# Patient Record
Sex: Female | Born: 1975 | Hispanic: No | Marital: Single | Smoking: Never smoker
Health system: Southern US, Community
[De-identification: ages and names within clinical notes are randomized; demographics above are authoritative.]

---

## 2002-05-03 ENCOUNTER — Other Ambulatory Visit: Admission: RE | Admit: 2002-05-03 | Discharge: 2002-05-03 | Payer: Self-pay | Admitting: Obstetrics and Gynecology

## 2003-07-11 ENCOUNTER — Other Ambulatory Visit: Admission: RE | Admit: 2003-07-11 | Discharge: 2003-07-11 | Payer: Self-pay | Admitting: Obstetrics and Gynecology

## 2004-11-12 ENCOUNTER — Other Ambulatory Visit: Admission: RE | Admit: 2004-11-12 | Discharge: 2004-11-12 | Payer: Self-pay | Admitting: Obstetrics and Gynecology

## 2005-11-18 ENCOUNTER — Other Ambulatory Visit: Admission: RE | Admit: 2005-11-18 | Discharge: 2005-11-18 | Payer: Self-pay | Admitting: Obstetrics and Gynecology

## 2008-11-03 ENCOUNTER — Ambulatory Visit: Payer: Self-pay | Admitting: Family Medicine

## 2012-05-12 ENCOUNTER — Other Ambulatory Visit: Payer: Self-pay | Admitting: Obstetrics and Gynecology

## 2012-05-12 DIAGNOSIS — N63 Unspecified lump in unspecified breast: Secondary | ICD-10-CM

## 2012-05-20 ENCOUNTER — Ambulatory Visit
Admission: RE | Admit: 2012-05-20 | Discharge: 2012-05-20 | Disposition: A | Discharge status: Home / Self Care | Payer: 59 | Source: Ambulatory Visit | Attending: Obstetrics and Gynecology | Admitting: Obstetrics and Gynecology

## 2012-05-20 ENCOUNTER — Other Ambulatory Visit: Payer: Self-pay | Admitting: Obstetrics and Gynecology

## 2012-05-20 DIAGNOSIS — N63 Unspecified lump in unspecified breast: Secondary | ICD-10-CM

## 2012-10-21 ENCOUNTER — Other Ambulatory Visit: Payer: Self-pay | Admitting: Obstetrics and Gynecology

## 2012-10-21 DIAGNOSIS — N63 Unspecified lump in unspecified breast: Secondary | ICD-10-CM

## 2012-11-23 ENCOUNTER — Ambulatory Visit
Admission: RE | Admit: 2012-11-23 | Discharge: 2012-11-23 | Disposition: A | Discharge status: Home / Self Care | Payer: 59 | Source: Ambulatory Visit | Attending: Obstetrics and Gynecology | Admitting: Obstetrics and Gynecology

## 2012-11-23 DIAGNOSIS — N63 Unspecified lump in unspecified breast: Secondary | ICD-10-CM

## 2013-05-09 ENCOUNTER — Other Ambulatory Visit: Payer: Self-pay | Admitting: Obstetrics and Gynecology

## 2013-05-09 DIAGNOSIS — N63 Unspecified lump in unspecified breast: Secondary | ICD-10-CM

## 2013-05-20 ENCOUNTER — Other Ambulatory Visit: Payer: 59

## 2013-05-23 ENCOUNTER — Ambulatory Visit
Admission: RE | Admit: 2013-05-23 | Discharge: 2013-05-23 | Disposition: A | Discharge status: Home / Self Care | Payer: 59 | Source: Ambulatory Visit | Attending: Obstetrics and Gynecology | Admitting: Obstetrics and Gynecology

## 2013-05-23 DIAGNOSIS — N63 Unspecified lump in unspecified breast: Secondary | ICD-10-CM

## 2013-07-04 DIAGNOSIS — L905 Scar conditions and fibrosis of skin: Secondary | ICD-10-CM | POA: Insufficient documentation

## 2013-07-13 ENCOUNTER — Encounter (INDEPENDENT_AMBULATORY_CARE_PROVIDER_SITE_OTHER): Payer: 59 | Admitting: Ophthalmology

## 2013-07-13 DIAGNOSIS — H35729 Serous detachment of retinal pigment epithelium, unspecified eye: Secondary | ICD-10-CM

## 2013-07-13 DIAGNOSIS — H33309 Unspecified retinal break, unspecified eye: Secondary | ICD-10-CM

## 2013-07-13 DIAGNOSIS — H43819 Vitreous degeneration, unspecified eye: Secondary | ICD-10-CM

## 2013-07-13 DIAGNOSIS — H3530 Unspecified macular degeneration: Secondary | ICD-10-CM

## 2013-07-14 ENCOUNTER — Encounter (INDEPENDENT_AMBULATORY_CARE_PROVIDER_SITE_OTHER): Payer: Self-pay | Admitting: Ophthalmology

## 2013-07-29 ENCOUNTER — Ambulatory Visit (INDEPENDENT_AMBULATORY_CARE_PROVIDER_SITE_OTHER): Payer: 59 | Admitting: Ophthalmology

## 2013-07-29 DIAGNOSIS — H33309 Unspecified retinal break, unspecified eye: Secondary | ICD-10-CM

## 2013-07-29 DIAGNOSIS — H35719 Central serous chorioretinopathy, unspecified eye: Secondary | ICD-10-CM

## 2013-09-05 ENCOUNTER — Encounter (INDEPENDENT_AMBULATORY_CARE_PROVIDER_SITE_OTHER): Payer: 59 | Admitting: Ophthalmology

## 2013-09-05 DIAGNOSIS — H33309 Unspecified retinal break, unspecified eye: Secondary | ICD-10-CM

## 2013-09-05 DIAGNOSIS — H43819 Vitreous degeneration, unspecified eye: Secondary | ICD-10-CM

## 2013-09-05 DIAGNOSIS — H35719 Central serous chorioretinopathy, unspecified eye: Secondary | ICD-10-CM

## 2014-01-10 ENCOUNTER — Ambulatory Visit (INDEPENDENT_AMBULATORY_CARE_PROVIDER_SITE_OTHER): Payer: 59 | Admitting: Ophthalmology

## 2014-01-10 DIAGNOSIS — H43819 Vitreous degeneration, unspecified eye: Secondary | ICD-10-CM

## 2014-01-10 DIAGNOSIS — H3530 Unspecified macular degeneration: Secondary | ICD-10-CM

## 2014-01-10 DIAGNOSIS — H35719 Central serous chorioretinopathy, unspecified eye: Secondary | ICD-10-CM

## 2014-07-25 ENCOUNTER — Other Ambulatory Visit: Payer: Self-pay | Admitting: Obstetrics and Gynecology

## 2014-07-25 DIAGNOSIS — D242 Benign neoplasm of left breast: Secondary | ICD-10-CM

## 2014-08-11 ENCOUNTER — Ambulatory Visit
Admission: RE | Admit: 2014-08-11 | Discharge: 2014-08-11 | Disposition: A | Discharge status: Home / Self Care | Payer: 59 | Source: Ambulatory Visit | Attending: Obstetrics and Gynecology | Admitting: Obstetrics and Gynecology

## 2014-08-11 DIAGNOSIS — D242 Benign neoplasm of left breast: Secondary | ICD-10-CM

## 2016-04-07 ENCOUNTER — Other Ambulatory Visit: Payer: Self-pay | Admitting: Obstetrics and Gynecology

## 2016-04-07 DIAGNOSIS — Z1231 Encounter for screening mammogram for malignant neoplasm of breast: Secondary | ICD-10-CM

## 2016-05-14 ENCOUNTER — Ambulatory Visit
Admission: RE | Admit: 2016-05-14 | Discharge: 2016-05-14 | Disposition: A | Discharge status: Home / Self Care | Payer: 59 | Source: Ambulatory Visit | Attending: Obstetrics and Gynecology | Admitting: Obstetrics and Gynecology

## 2016-05-14 DIAGNOSIS — Z1231 Encounter for screening mammogram for malignant neoplasm of breast: Secondary | ICD-10-CM

## 2017-05-08 DIAGNOSIS — E039 Hypothyroidism, unspecified: Secondary | ICD-10-CM | POA: Insufficient documentation

## 2017-05-08 DIAGNOSIS — E559 Vitamin D deficiency, unspecified: Secondary | ICD-10-CM | POA: Insufficient documentation

## 2017-12-08 DIAGNOSIS — Z9889 Other specified postprocedural states: Secondary | ICD-10-CM | POA: Insufficient documentation

## 2018-01-17 IMAGING — MG 2D DIGITAL SCREENING BILATERAL MAMMOGRAM WITH CAD AND ADJUNCT TO
9 of 12 series · 9 of 28 positions shown · non-contrast
Comparison: Previous exam(s).

CLINICAL DATA: Screening.

EXAM:
2D DIGITAL SCREENING BILATERAL MAMMOGRAM WITH CAD AND ADJUNCT TOMO

[L MLO synth-2D]
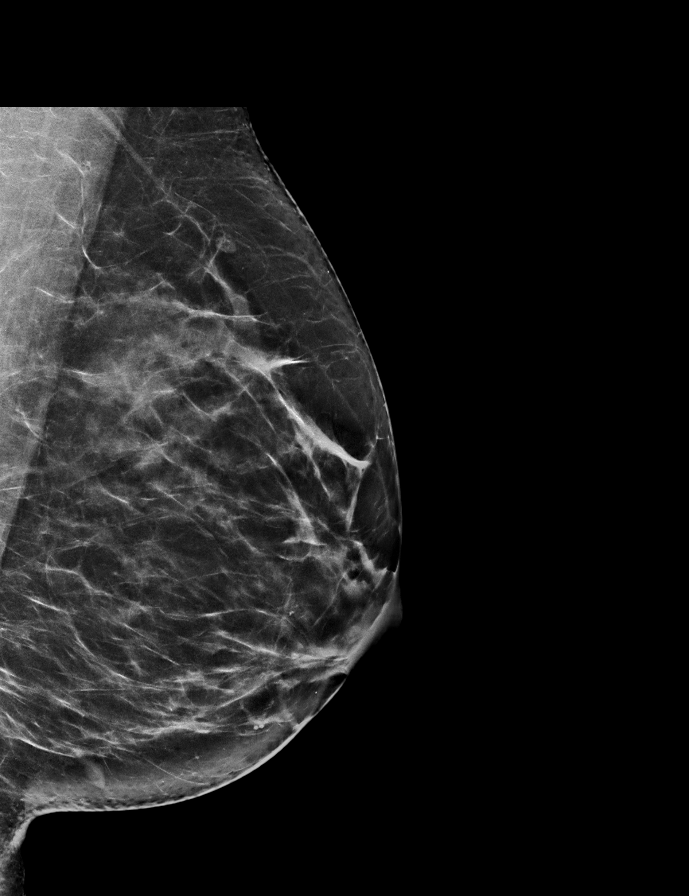

[R CC synth-2D]
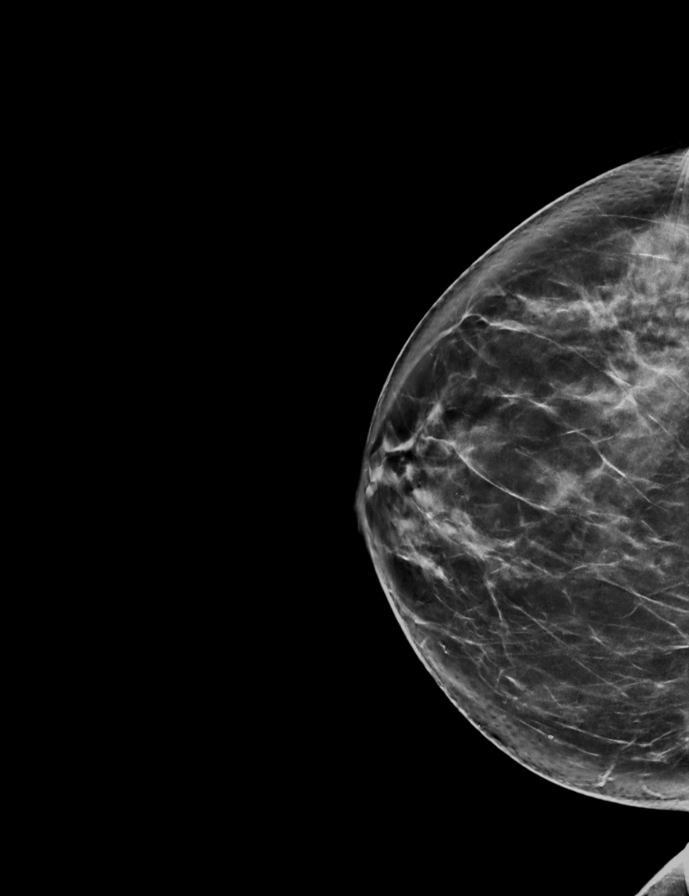

[L MLO]
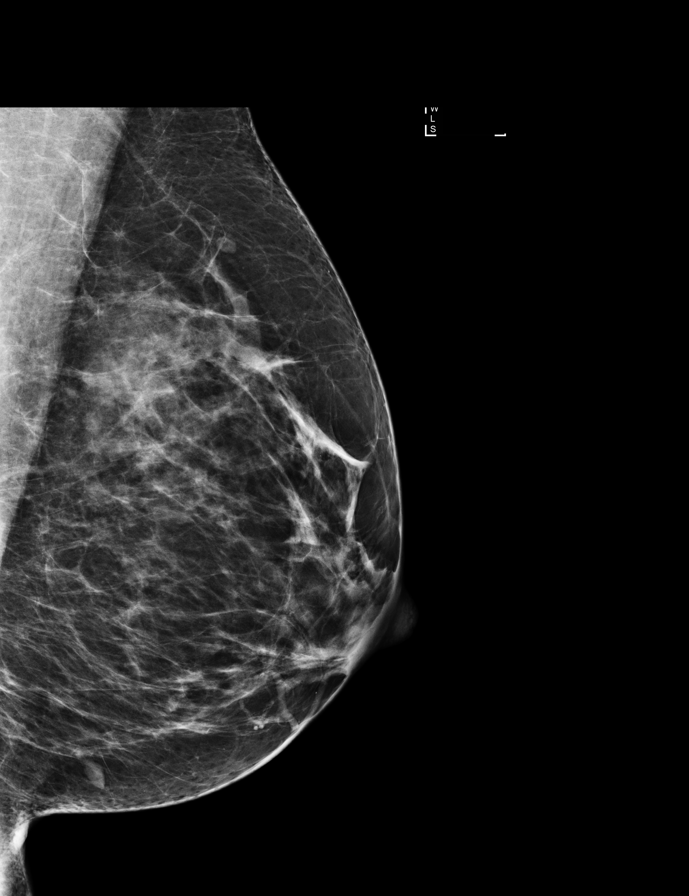

[R CC]
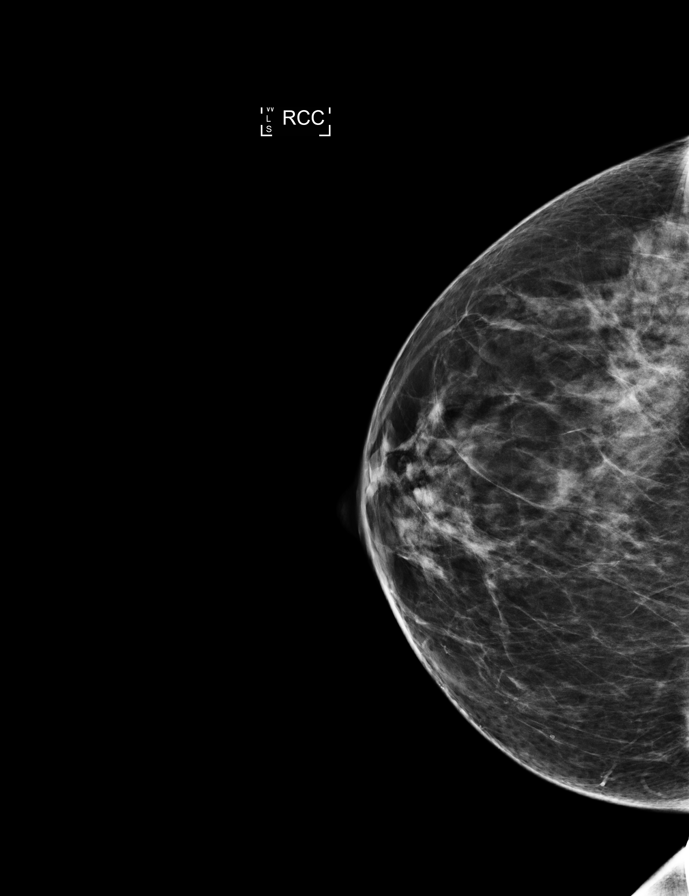

[R MLO]
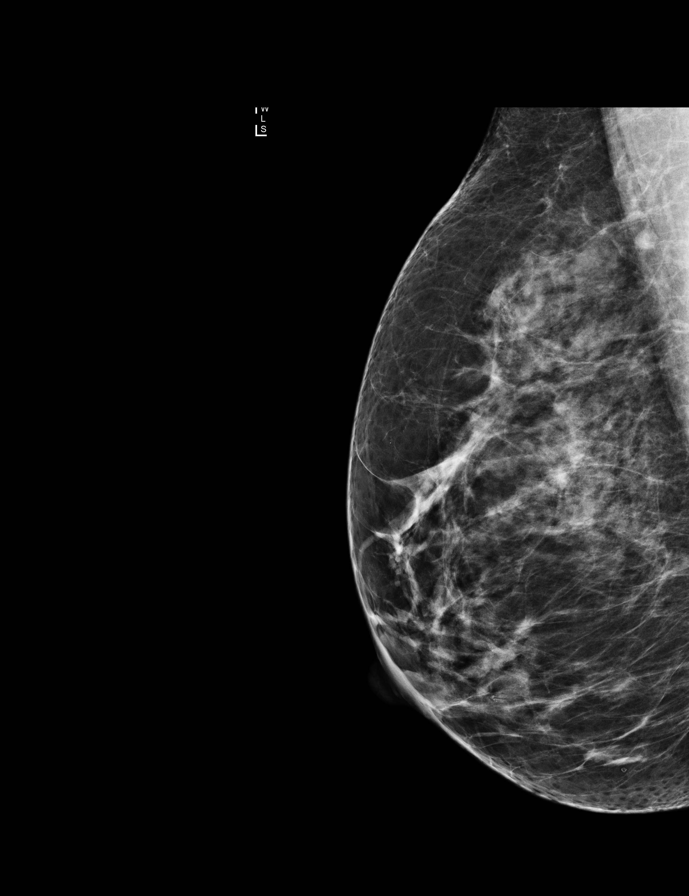

[L CC synth-2D]
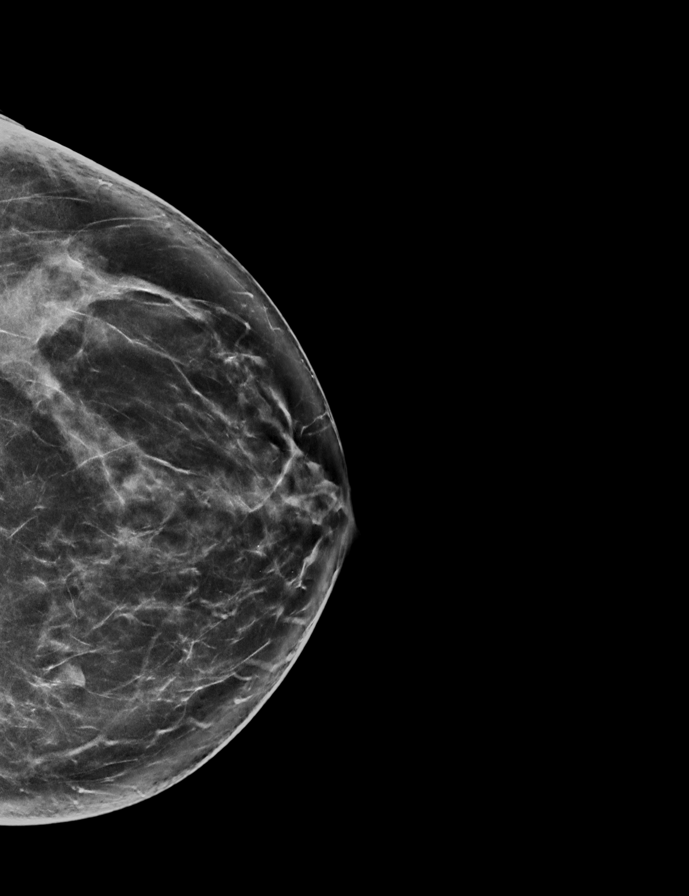

[L CC]
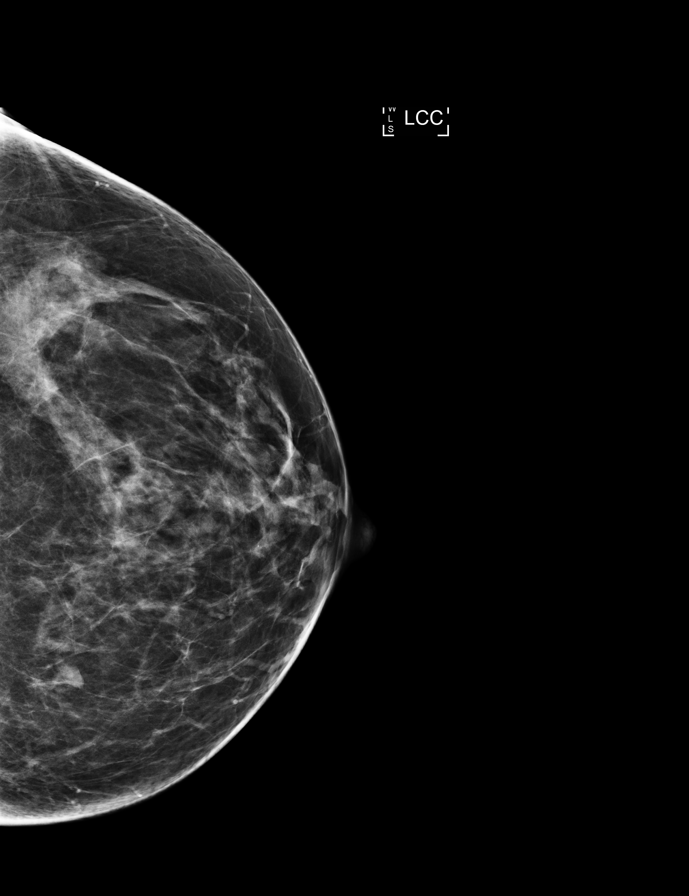

[R MLO synth-2D]
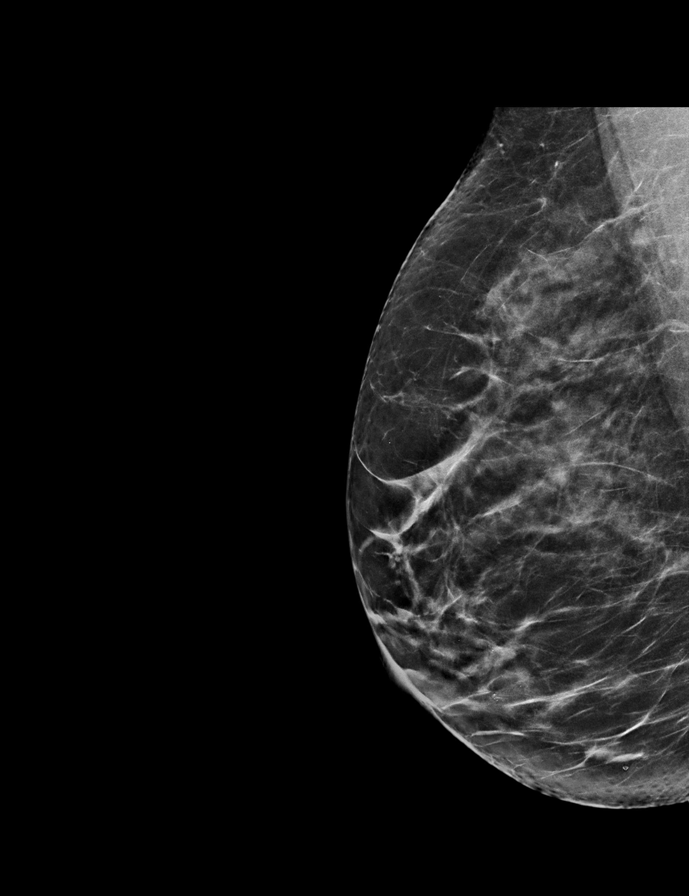

[R MLO tomo · tomo slice 33/66.0]
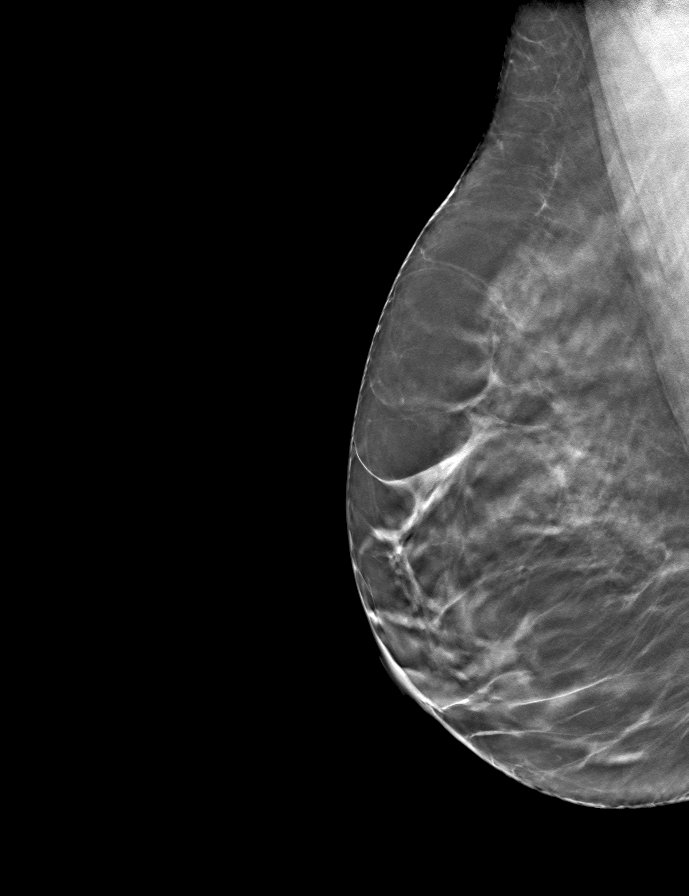

[9 of 28 positions shown; findings below may reference images not displayed]

ACR Breast Density Category b: There are scattered areas of
fibroglandular density.
FINDINGS: There are no findings suspicious for malignancy. Images were
processed with CAD.
IMPRESSION: No mammographic evidence of malignancy. A result letter of this
screening mammogram will be mailed directly to the patient.

RECOMMENDATION:
Screening mammogram in one year. (Code:97-6-RS4)

BI-RADS CATEGORY  1: Negative.

## 2019-10-19 ENCOUNTER — Telehealth: Payer: Self-pay | Admitting: Family Medicine

## 2019-10-19 NOTE — Telephone Encounter (Signed)
Patient called wanting to get a status on her functional medicine appointment.  She stated that you can leave her a detailed message on her work number of [phone].  If she does not answer, she will call you back.  Thank you.

## 2019-10-19 NOTE — Telephone Encounter (Signed)
You have her email address - it is in a staff message. You were going to email her first. Are we to make her an appointment?

## 2019-10-19 NOTE — Telephone Encounter (Signed)
Please advise 

## 2019-10-20 NOTE — Telephone Encounter (Signed)
Somehow the message was in archives and I never saw it.  I sent her an email just now.

## 2019-10-20 NOTE — Telephone Encounter (Signed)
I left a voice mail (patient stated her full name on the message) for patient to check her email for a message from Dr. Junius Roads.

## 2019-10-21 ENCOUNTER — Telehealth: Payer: Self-pay | Admitting: Family Medicine

## 2019-10-21 NOTE — Telephone Encounter (Signed)
Patient called requesting Terri K. e-mail her. Patient states she is extremely busy and Terri and herself have been playing phone tag. Patient e-mail is [email or web address].

## 2019-10-25 NOTE — Telephone Encounter (Signed)
At your convenience, will you call this patient to schedule a 40-minute appointment with Dr. Junius Roads - new functional medicine patient. I cannot email her, as she wishes. She would be a good candidate for MyChart.

## 2019-11-11 ENCOUNTER — Ambulatory Visit (INDEPENDENT_AMBULATORY_CARE_PROVIDER_SITE_OTHER): Payer: Managed Care, Other (non HMO) | Admitting: Family Medicine

## 2019-11-11 ENCOUNTER — Other Ambulatory Visit: Payer: Self-pay

## 2019-11-11 ENCOUNTER — Encounter: Payer: Self-pay | Admitting: Family Medicine

## 2019-11-11 VITALS — BP 105/72 | HR 88 | Ht 66.5 in | Wt 167.0 lb

## 2019-11-11 DIAGNOSIS — R5382 Chronic fatigue, unspecified: Secondary | ICD-10-CM | POA: Diagnosis not present

## 2019-11-11 DIAGNOSIS — R21 Rash and other nonspecific skin eruption: Secondary | ICD-10-CM | POA: Diagnosis not present

## 2019-11-11 DIAGNOSIS — E039 Hypothyroidism, unspecified: Secondary | ICD-10-CM

## 2019-11-11 DIAGNOSIS — E559 Vitamin D deficiency, unspecified: Secondary | ICD-10-CM | POA: Diagnosis not present

## 2019-11-11 DIAGNOSIS — K5909 Other constipation: Secondary | ICD-10-CM

## 2019-11-11 MED ORDER — CLOTRIMAZOLE 1 % EX CREA: 1.0000 "application " | TOPICAL_CREAM | Freq: Two times a day (BID) | CUTANEOUS | 3 refills | Status: AC

## 2019-11-11 NOTE — Progress Notes (Signed)
Office Visit Note   Patient: Stacey Moon           Date of Birth: 1975-09-04           MRN: [id number] Visit Date: 11/11/2019 Requested by: No referring provider defined for this encounter. PCP: No primary care provider on file.  Subjective: Chief Complaint  Patient presents with  . FUNCTIONAL MEDICINE    would like to discuss what can be offered, nutritional testing    HPI: She is here to establish care.  She found me through the [facility] website.  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  She will finish her training in August.  She's doing this through the [employer].  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  It became extremely large and she required surgery.  She had extensive scar tissue.  Eventually it recurred only it was growing inward instead of outward and she had to have more surgeries for that.  She has had more than 20 surgeries per her report.  She has struggled with chronic fatigue.  She also has chronic constipation with gastrointestinal dysfunction.  She states that multiple family members have similar symptoms.  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  She also does not tolerate lactose.  She has never tried a food elimination diet.  She takes probiotics.  She has a history of hypothyroidism.  She does not know whether she is ever had antibodies checked.  She currently takes Synthroid for this.  Her last labs were in January.  She has developed a rash on her right anterior lower chest.  This has been present for several months.  Does not itch.  She has a history of vitamin D deficiency.  She is not currently taking anything for it.  She has been through a lot of stress in the past year.  Her company has been downsizing and she has been afraid of losing her job.  Unfortunately her 49 year old grandmother died, she was very close to her.  She is interested in having evaluation for  nutritional status.  Family history is positive for lymphoma in a couple family members.                 Objective: Vital Signs: BP 105/72   Pulse 88   Ht 5' 6.5" (1.689 m)   Wt 167 lb (75.8 kg)   BMI 26.55 kg/m   Physical Exam:  General:  Alert and oriented, in no acute distress. Pulm:  Breathing unlabored. Psy:  Normal mood, congruent affect. Skin: She has a rash below her eyebrows.  She has 2 circular salmon-colored lesions lower right anterior chest wall. HEENT:  [place]/AT, PERRLA, EOM Full, no nystagmus.  Funduscopic examination within normal limits.  No conjunctival erythema.  Tympanic membranes are pearly gray with normal landmarks.  External ear canals are normal.  Nasal passages are clear.  Oropharynx is clear.  No significant lymphadenopathy.  No thyromegaly or nodules.  2+ carotid pulses without bruits.  She has scarring on the right anterolateral neck. CV: Regular rate and rhythm without murmurs, rubs, or gallops.  No peripheral edema.  2+ radial and posterior tibial pulses. Lungs: Clear to auscultation throughout with no wheezing or areas of consolidation. Abd: Bowel sounds are active, no hepatosplenomegaly or masses.  Soft and nontender.  No audible bruits.  No evidence of ascites. Extremities: 2+ upper and lower DTRs.  No nail deformities.   Imaging: No results found.  Assessment & Plan: 1.  Chronic fatigue with constipation, suspect gut dysfunction. -We will contemplate doing stool testing through [facility] labs. -Consider a food elimination diet as well.  2.  Hypothyroidism -Labs today including thyroid antibodies.  3.  Rash of right lower anterior chest wall, possible tinea corporis -Trial of clotrimazole.  4. Nutritional status - We might order additional testing through [facility].     Procedures: No procedures performed  No notes on file     PMFS History: Patient Active Problem List   Diagnosis Date Noted  . Chronic fatigue 11/11/2019  . Chronic  constipation 11/11/2019  . H/O removal of neck cyst 12/08/2017  . Acquired hypothyroidism 05/08/2017  . Vitamin D deficiency 05/08/2017  . Scar of neck 07/04/2013   History reviewed. No pertinent past medical history.  History reviewed. No pertinent family history.  History reviewed. No pertinent surgical history. Social History   Occupational History  . Not on file  Tobacco Use  . Smoking status: Never Smoker  . Smokeless tobacco: Never Used  Substance and Sexual Activity  . Alcohol use: Yes    Comment: rare  . Drug use: Not on file  . Sexual activity: Not on file

## 2019-11-14 ENCOUNTER — Telehealth: Payer: Self-pay | Admitting: Family Medicine

## 2019-11-14 LAB — COMPREHENSIVE METABOLIC PANEL
AG Ratio: 1.8 (calc) (ref 1.0–2.5)
ALT: 13 U/L (ref 6–29)
AST: 15 U/L (ref 10–30)
Albumin: 4.4 g/dL (ref 3.6–5.1)
Alkaline phosphatase (APISO): 74 U/L (ref 31–125)
BUN: 13 mg/dL (ref 7–25)
CO2: 30 mmol/L (ref 20–32)
Calcium: 9.8 mg/dL (ref 8.6–10.2)
Chloride: 103 mmol/L (ref 98–110)
Creat: 0.88 mg/dL (ref 0.50–1.10)
Globulin: 2.5 g/dL (calc) (ref 1.9–3.7)
Glucose, Bld: 75 mg/dL (ref 65–99)
Potassium: 4.7 mmol/L (ref 3.5–5.3)
Sodium: 139 mmol/L (ref 135–146)
Total Bilirubin: 0.6 mg/dL (ref 0.2–1.2)
Total Protein: 6.9 g/dL (ref 6.1–8.1)

## 2019-11-14 LAB — CBC WITH DIFFERENTIAL/PLATELET
Absolute Monocytes: 490 cells/uL (ref 200–950)
Basophils Absolute: 59 cells/uL (ref 0–200)
Basophils Relative: 1.2 %
Eosinophils Absolute: 59 cells/uL (ref 15–500)
Eosinophils Relative: 1.2 %
HCT: 40.4 % (ref 35.0–45.0)
Hemoglobin: 13.5 g/dL (ref 11.7–15.5)
Lymphs Abs: 549 cells/uL — ABNORMAL LOW (ref 850–3900)
MCH: 32.1 pg (ref 27.0–33.0)
MCHC: 33.4 g/dL (ref 32.0–36.0)
MCV: 96 fL (ref 80.0–100.0)
MPV: 11.2 fL (ref 7.5–12.5)
Monocytes Relative: 10 %
Neutro Abs: 3744 cells/uL (ref 1500–7800)
Neutrophils Relative %: 76.4 %
Platelets: 264 10*3/uL (ref 140–400)
RBC: 4.21 10*6/uL (ref 3.80–5.10)
RDW: 11.8 % (ref 11.0–15.0)
Total Lymphocyte: 11.2 %
WBC: 4.9 10*3/uL (ref 3.8–10.8)

## 2019-11-14 LAB — LIPID PANEL
Cholesterol: 147 mg/dL (ref <200)
HDL: 59 mg/dL (ref >=50)
LDL Cholesterol (Calc): 74 mg/dL (calc)
Non-HDL Cholesterol (Calc): 88 mg/dL (calc) (ref <130)
Total CHOL/HDL Ratio: 2.5 (calc) (ref <5.0)
Triglycerides: 61 mg/dL (ref <150)

## 2019-11-14 LAB — THYROID PANEL WITH TSH
Free Thyroxine Index: 3.8 (ref 1.4–3.8)
T3 Uptake: 30 % (ref 22–35)
T4, Total: 12.5 ug/dL — ABNORMAL HIGH (ref 5.1–11.9)
TSH: 0.56 mIU/L

## 2019-11-14 LAB — ANA: Anti Nuclear Antibody (ANA): NEGATIVE

## 2019-11-14 LAB — VITAMIN D 25 HYDROXY (VIT D DEFICIENCY, FRACTURES): Vit D, 25-Hydroxy: 35 ng/mL (ref 30–100)

## 2019-11-14 LAB — HIGH SENSITIVITY CRP: hs-CRP: 5.5 mg/L — ABNORMAL HIGH

## 2019-11-14 LAB — THYROID PEROXIDASE ANTIBODY: Thyroperoxidase Ab SerPl-aCnc: 19 IU/mL — ABNORMAL HIGH (ref <9)

## 2019-11-14 NOTE — Telephone Encounter (Signed)
Labs are notable for the following:  C-reactive protein, inflammation marker, is elevated at 5.5.  Thyroid T4 is slightly elevated at 12.5, but TSH looks perfect at 0.56.  No changes needed in Synthroid dosage based on these numbers.  Thyroid antibodies are positive at 19, indicating autoimmune thyroid disease, also known as Hashimoto's.  Will try a food elimination diet.  All other labs look good.

## 2020-07-13 ENCOUNTER — Other Ambulatory Visit: Payer: Self-pay | Admitting: Endocrinology

## 2020-07-13 DIAGNOSIS — E039 Hypothyroidism, unspecified: Secondary | ICD-10-CM

## 2021-07-04 ENCOUNTER — Telehealth: Payer: Self-pay | Admitting: Family Medicine

## 2021-07-04 NOTE — Telephone Encounter (Signed)
Records emailed to [facility]

## 2021-08-28 ENCOUNTER — Other Ambulatory Visit: Payer: Self-pay | Admitting: Family Medicine

## 2021-08-28 DIAGNOSIS — E041 Nontoxic single thyroid nodule: Secondary | ICD-10-CM

## 2021-08-28 DIAGNOSIS — Z1231 Encounter for screening mammogram for malignant neoplasm of breast: Secondary | ICD-10-CM

## 2021-09-09 ENCOUNTER — Ambulatory Visit
Admission: RE | Admit: 2021-09-09 | Discharge: 2021-09-09 | Disposition: A | Discharge status: Home / Self Care | Payer: Managed Care, Other (non HMO) | Source: Ambulatory Visit | Attending: Family Medicine | Admitting: Family Medicine

## 2021-09-09 DIAGNOSIS — E041 Nontoxic single thyroid nodule: Secondary | ICD-10-CM

## 2021-10-15 ENCOUNTER — Ambulatory Visit
Admission: RE | Admit: 2021-10-15 | Discharge: 2021-10-15 | Disposition: A | Discharge status: Home / Self Care | Payer: 59 | Source: Ambulatory Visit | Attending: Family Medicine | Admitting: Family Medicine

## 2021-10-15 DIAGNOSIS — Z1231 Encounter for screening mammogram for malignant neoplasm of breast: Secondary | ICD-10-CM

## 2024-01-20 ENCOUNTER — Ambulatory Visit (INDEPENDENT_AMBULATORY_CARE_PROVIDER_SITE_OTHER): Admitting: Family Medicine

## 2024-01-20 VITALS — BP 108/72 | Ht 66.5 in | Wt 180.0 lb

## 2024-01-20 DIAGNOSIS — M2142 Flat foot [pes planus] (acquired), left foot: Secondary | ICD-10-CM | POA: Diagnosis not present

## 2024-01-20 DIAGNOSIS — M79672 Pain in left foot: Secondary | ICD-10-CM

## 2024-01-20 DIAGNOSIS — M2141 Flat foot [pes planus] (acquired), right foot: Secondary | ICD-10-CM

## 2024-01-20 NOTE — Progress Notes (Unsigned)
 PCP: Sophronia Ozell BROCKS, MD  Subjective:   HPI: Patient is a 48 y.o. female here for evaluation of left foot pain.  Patient reports that she has been experiencing left foot pain for the past 2 years. She describes the pain as off and on again and says that the location of the pain has changed over the years. 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 She has tried seeing a massage therapist and wearing an ankle brace but says the ankle brace made her pain worse. She is not taking any medication for her foot pain and has not tried applying ice or heat to the area.   No past medical history on file.  Current Outpatient Medications on File Prior to Visit  Medication Sig Dispense Refill   clotrimazole  (LOTRIMIN ) 1 % cream Apply 1 application topically 2 (two) times daily. 30 g 3   Probiotic Product (MISC INTESTINAL FLORA REGULAT) CAPS Take by mouth.     SYNTHROID 100 MCG tablet Take 100 mcg by mouth every morning.     No current facility-administered medications on file prior to visit.    No past surgical history on file.  Allergies  Allergen Reactions   Garlic Nausea Only and Rash   Hydrocortisone Rash   Lactose Rash    BP 108/72   Ht 5' 6.5 (1.689 m)   Wt 180 lb (81.6 kg)   BMI 28.62 kg/m       No data to display              No data to display              Objective:  Physical Exam:  Gen: NAD, comfortable in exam room  MSK: Bilateral feet Inspection: No bony or soft tissue abnormalities appreciated. The forefeet are wide and there is long arch collapse of the bilateral feet, left worse than right.  Palpation: There is tenderness to palpation at the base of the 5th metatarsal and with compression of the forefoot.  ROM: FROM with plantarflexion/dorsiflexion and ankle eversion/inversion.  Strength: 5/5 with resisted dorsiflexion/plantarflexion and  resisted ankle inversion/eversion.  Neuro/Vasc: Neurovascularly intact distally.    Assessment & Plan:  1. Chronic left foot pain - Suspect patients left foot pain is secondary to the long arch collapse of her left foot, causing overpronation. Discussed with patient that it is reassuring that her pain has seemed to migrate to different locations throughout her left foot over time, which makes us  less concerned for pathologies such as a stress fracture, achilles tendonitis, or plantar fasciitis. Therefore, imaging is not indicated at this time. Discussed with patient that treatment for her foot pain consists of arch support and ankle strengthening exercises. Patient is amenable to this plan.  - Home exercises for ankle strengthening  - Arch supports (superfeet, spencos, Hapad inserts with small scaphoid pads)   - Avoid flat shoes and barefoot walking as much as possible  - Continue activities as tolerated  - Tylenol or Aleve as needed for pain  - Follow-up in 6 weeks  Signe Ravel, MS4 [facility]

## 2024-01-20 NOTE — Patient Instructions (Signed)
 Your exam is reassuring. This is consistent with more of a dynamic issue related to overpronation. Do home exercises for ankle strengthening - you can do intrinsic foot strengthening as well. Arch supports are important as well - superfeet, spencos, Hapad inserts with small scaphoid pads are all options. Avoid flat shoes, barefoot walking as much as possible. Activities as tolerated. Tylenol, aleve only if needed (these are unlikely to help this improve but can help with pain if it's bad). Follow up in 6 weeks.
